# Patient Record
Sex: Female | Born: 1945 | Race: Black or African American | Hispanic: No | Smoking: Never smoker
Health system: Southern US, Community
[De-identification: ages and names within clinical notes are randomized; demographics above are authoritative.]

## PROBLEM LIST (undated history)

## (undated) DIAGNOSIS — M545 Other chronic pain: Secondary | ICD-10-CM

## (undated) DIAGNOSIS — E7800 Pure hypercholesterolemia, unspecified: Secondary | ICD-10-CM

## (undated) DIAGNOSIS — I10 Essential (primary) hypertension: Secondary | ICD-10-CM

## (undated) DIAGNOSIS — E119 Type 2 diabetes mellitus without complications: Secondary | ICD-10-CM

## (undated) DIAGNOSIS — G8929 Other chronic pain: Secondary | ICD-10-CM

## (undated) HISTORY — PX: CHOLECYSTECTOMY: SHX55

## (undated) HISTORY — PX: ABDOMINAL HYSTERECTOMY: SHX81

---

## 2015-10-31 ENCOUNTER — Emergency Department (HOSPITAL_COMMUNITY): Payer: No Typology Code available for payment source

## 2015-10-31 ENCOUNTER — Emergency Department (HOSPITAL_COMMUNITY)
Admission: EM | Admit: 2015-10-31 | Discharge: 2015-10-31 | Disposition: A | Discharge status: Home / Self Care | Payer: No Typology Code available for payment source | Attending: Emergency Medicine | Admitting: Emergency Medicine

## 2015-10-31 ENCOUNTER — Encounter (HOSPITAL_COMMUNITY): Payer: Self-pay | Admitting: Vascular Surgery

## 2015-10-31 DIAGNOSIS — S8992XA Unspecified injury of left lower leg, initial encounter: Secondary | ICD-10-CM | POA: Insufficient documentation

## 2015-10-31 DIAGNOSIS — I10 Essential (primary) hypertension: Secondary | ICD-10-CM | POA: Diagnosis not present

## 2015-10-31 DIAGNOSIS — S8991XA Unspecified injury of right lower leg, initial encounter: Secondary | ICD-10-CM | POA: Diagnosis not present

## 2015-10-31 DIAGNOSIS — E782 Mixed hyperlipidemia: Secondary | ICD-10-CM | POA: Diagnosis not present

## 2015-10-31 DIAGNOSIS — S161XXA Strain of muscle, fascia and tendon at neck level, initial encounter: Secondary | ICD-10-CM | POA: Insufficient documentation

## 2015-10-31 DIAGNOSIS — M545 Low back pain, unspecified: Secondary | ICD-10-CM

## 2015-10-31 DIAGNOSIS — S199XXA Unspecified injury of neck, initial encounter: Secondary | ICD-10-CM | POA: Diagnosis present

## 2015-10-31 DIAGNOSIS — M25561 Pain in right knee: Secondary | ICD-10-CM

## 2015-10-31 DIAGNOSIS — G8929 Other chronic pain: Secondary | ICD-10-CM | POA: Insufficient documentation

## 2015-10-31 DIAGNOSIS — S3992XA Unspecified injury of lower back, initial encounter: Secondary | ICD-10-CM | POA: Diagnosis not present

## 2015-10-31 DIAGNOSIS — Y998 Other external cause status: Secondary | ICD-10-CM | POA: Diagnosis not present

## 2015-10-31 DIAGNOSIS — Z7982 Long term (current) use of aspirin: Secondary | ICD-10-CM | POA: Insufficient documentation

## 2015-10-31 DIAGNOSIS — E119 Type 2 diabetes mellitus without complications: Secondary | ICD-10-CM | POA: Diagnosis not present

## 2015-10-31 DIAGNOSIS — Z79899 Other long term (current) drug therapy: Secondary | ICD-10-CM | POA: Diagnosis not present

## 2015-10-31 DIAGNOSIS — Y92410 Unspecified street and highway as the place of occurrence of the external cause: Secondary | ICD-10-CM | POA: Insufficient documentation

## 2015-10-31 DIAGNOSIS — M25562 Pain in left knee: Secondary | ICD-10-CM

## 2015-10-31 DIAGNOSIS — Y9389 Activity, other specified: Secondary | ICD-10-CM | POA: Diagnosis not present

## 2015-10-31 HISTORY — DX: Pure hypercholesterolemia, unspecified: E78.00

## 2015-10-31 HISTORY — DX: Other chronic pain: G89.29

## 2015-10-31 HISTORY — DX: Type 2 diabetes mellitus without complications: E11.9

## 2015-10-31 HISTORY — DX: Essential (primary) hypertension: I10

## 2015-10-31 HISTORY — DX: Low back pain: M54.5

## 2015-10-31 HISTORY — DX: Other chronic pain: M54.50

## 2015-10-31 MED ORDER — OXYCODONE-ACETAMINOPHEN 5-325 MG PO TABS
1.0000 | ORAL_TABLET | Freq: Once | ORAL | Status: AC
  Administered 2015-10-31: 1 via ORAL
  Filled 2015-10-31: qty 1

## 2015-10-31 MED ORDER — METHOCARBAMOL 500 MG PO TABS: 500.0000 mg | ORAL_TABLET | Freq: Two times a day (BID) | ORAL | Status: AC

## 2015-10-31 MED ORDER — HYDROCODONE-ACETAMINOPHEN 5-325 MG PO TABS: 1.0000 | ORAL_TABLET | ORAL | Status: AC | PRN

## 2015-10-31 MED ORDER — METHOCARBAMOL 500 MG PO TABS
500.0000 mg | ORAL_TABLET | Freq: Once | ORAL | Status: AC
  Administered 2015-10-31: 500 mg via ORAL
  Filled 2015-10-31: qty 1

## 2015-10-31 MED ORDER — ONDANSETRON 4 MG PO TBDP
4.0000 mg | ORAL_TABLET | Freq: Once | ORAL | Status: AC
  Administered 2015-10-31: 4 mg via ORAL
  Filled 2015-10-31: qty 1

## 2015-10-31 MED ORDER — IBUPROFEN 600 MG PO TABS: 600.0000 mg | ORAL_TABLET | Freq: Four times a day (QID) | ORAL | Status: AC | PRN
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
## 2015-11-01 ENCOUNTER — Telehealth: Payer: Self-pay | Admitting: *Deleted
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
